# Patient Record
Sex: Male | Born: 1945 | Race: Black or African American | Hispanic: No | Smoking: Never smoker
Health system: Southern US, Community
[De-identification: ages and names within clinical notes are randomized; demographics above are authoritative.]

## PROBLEM LIST (undated history)

## (undated) DIAGNOSIS — S8290XA Unspecified fracture of unspecified lower leg, initial encounter for closed fracture: Secondary | ICD-10-CM

## (undated) DIAGNOSIS — K469 Unspecified abdominal hernia without obstruction or gangrene: Secondary | ICD-10-CM

## (undated) DIAGNOSIS — R001 Bradycardia, unspecified: Secondary | ICD-10-CM

## (undated) HISTORY — PX: TONSILLECTOMY: SHX5217

## (undated) HISTORY — PX: REFRACTIVE SURGERY: SHX103

## (undated) HISTORY — PX: CIRCUMCISION: SHX1350

## (undated) HISTORY — DX: Unspecified abdominal hernia without obstruction or gangrene: K46.9

## (undated) HISTORY — DX: Unspecified fracture of unspecified lower leg, initial encounter for closed fracture: S82.90XA

## (undated) HISTORY — PX: HERNIA REPAIR: SHX51

## (undated) HISTORY — DX: Bradycardia, unspecified: R00.1

---

## 1998-03-06 ENCOUNTER — Ambulatory Visit (HOSPITAL_COMMUNITY): Admission: RE | Admit: 1998-03-06 | Discharge: 1998-03-06 | Payer: Self-pay | Admitting: Gastroenterology

## 2003-02-12 ENCOUNTER — Emergency Department (HOSPITAL_COMMUNITY): Admission: EM | Admit: 2003-02-12 | Discharge: 2003-02-13 | Payer: Self-pay | Admitting: *Deleted

## 2009-01-31 ENCOUNTER — Encounter: Payer: Self-pay | Admitting: Cardiovascular Disease

## 2010-02-07 ENCOUNTER — Ambulatory Visit: Payer: Self-pay | Admitting: Cardiovascular Disease

## 2011-01-21 ENCOUNTER — Encounter: Payer: Self-pay | Admitting: Cardiovascular Disease

## 2011-01-21 ENCOUNTER — Ambulatory Visit (INDEPENDENT_AMBULATORY_CARE_PROVIDER_SITE_OTHER): Payer: Self-pay | Admitting: Cardiovascular Disease

## 2011-01-21 VITALS — BP 139/82 | HR 51 | Ht 75.0 in | Wt 193.0 lb

## 2011-01-21 DIAGNOSIS — R001 Bradycardia, unspecified: Secondary | ICD-10-CM | POA: Insufficient documentation

## 2011-01-21 DIAGNOSIS — I498 Other specified cardiac arrhythmias: Secondary | ICD-10-CM

## 2011-01-21 NOTE — Progress Notes (Signed)
    Joshua Shea Date of Birth  February 11, 1945 [facility]     [place] [facility]  [address]    [address]   [address] 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  He had a negative stress test in the past.   He walks on a regular basis and has no limitations.   He goes to [facility] on [address] as his primary medical care.  No current outpatient prescriptions on file prior to visit.    No Known Allergies  No past medical history on file.  No past surgical history on file.  History  Smoking status  . Never Smoker   Smokeless tobacco  . Not on file    History  Alcohol Use: Not on file    Family History  Problem Relation Age of Onset  . Hypertension Mother     Reviw of Systems:  Reviewed in the HPI.  All other systems are negative.  Physical Exam: BP 139/82  Pulse 51  Ht 6\' 3"  (1.905 m)  Wt 193 lb (87.544 kg)  BMI 24.12 kg/m2 The patient is alert and oriented x 3.  The mood and affect are normal.   Skin: warm and dry.  Color is normal.    HEENT:   [place]/AT , no JVD, normal carotids  Lungs: clear   Heart: RR, bradycardic    Abdomen: soft, non tender, +BS  Extremities:  No c/c/e  Neuro:  Nonfocal, gait is normal    ECG: Sinus bradycardia, early repol  Assessment / Plan:

## 2011-01-21 NOTE — Patient Instructions (Signed)
Your physician recommends that you schedule a follow-up appointment in: as needed basis  

## 2011-01-21 NOTE — Assessment & Plan Note (Signed)
Joshua Shea presents for followup of his bradycardia. He's completely asymptomatic. He's been able to do all of his normal activities without any significant problems. I offer to see him once a year but he would rather come back if he has a new cardiac issues. I'll be happy to see him in the future as needed.

## 2011-01-22 ENCOUNTER — Telehealth: Payer: Self-pay | Admitting: Cardiovascular Disease

## 2011-01-22 NOTE — Telephone Encounter (Signed)
Phone number is listed has been disconnected per recording.  Work number states pt does not work there.

## 2011-01-22 NOTE — Telephone Encounter (Signed)
New msg Pt was told to call back with names of meds he has been taking rapaflo no longer takes and jalyn

## 2011-06-05 ENCOUNTER — Encounter: Payer: Self-pay | Admitting: *Deleted

## 2012-07-05 ENCOUNTER — Ambulatory Visit (INDEPENDENT_AMBULATORY_CARE_PROVIDER_SITE_OTHER): Payer: Medicare Other | Admitting: Urology

## 2012-07-05 DIAGNOSIS — R82998 Other abnormal findings in urine: Secondary | ICD-10-CM

## 2012-07-05 DIAGNOSIS — N400 Enlarged prostate without lower urinary tract symptoms: Secondary | ICD-10-CM

## 2017-09-21 ENCOUNTER — Emergency Department (HOSPITAL_BASED_OUTPATIENT_CLINIC_OR_DEPARTMENT_OTHER): Payer: Medicare Other

## 2017-09-21 ENCOUNTER — Emergency Department (HOSPITAL_COMMUNITY): Payer: Medicare Other

## 2017-09-21 ENCOUNTER — Encounter (HOSPITAL_COMMUNITY): Payer: Self-pay | Admitting: Internal Medicine

## 2017-09-21 ENCOUNTER — Emergency Department (HOSPITAL_COMMUNITY)
Admission: EM | Admit: 2017-09-21 | Discharge: 2017-10-12 | Disposition: E | Payer: Medicare Other | Attending: Emergency Medicine | Admitting: Emergency Medicine

## 2017-09-21 DIAGNOSIS — R55 Syncope and collapse: Secondary | ICD-10-CM | POA: Diagnosis present

## 2017-09-21 DIAGNOSIS — R531 Weakness: Secondary | ICD-10-CM | POA: Diagnosis not present

## 2017-09-21 DIAGNOSIS — R001 Bradycardia, unspecified: Secondary | ICD-10-CM | POA: Diagnosis not present

## 2017-09-21 DIAGNOSIS — R5383 Other fatigue: Secondary | ICD-10-CM | POA: Diagnosis not present

## 2017-09-21 DIAGNOSIS — Z79899 Other long term (current) drug therapy: Secondary | ICD-10-CM | POA: Diagnosis not present

## 2017-09-21 DIAGNOSIS — I469 Cardiac arrest, cause unspecified: Secondary | ICD-10-CM | POA: Diagnosis not present

## 2017-09-21 LAB — CBC WITH DIFFERENTIAL/PLATELET
Abs Immature Granulocytes: 0.1 10*3/uL (ref 0.0–0.1)
BASOS PCT: 1 %
Basophils Absolute: 0.1 10*3/uL (ref 0.0–0.1)
EOS ABS: 0.1 10*3/uL (ref 0.0–0.7)
Eosinophils Relative: 1 %
HCT: 45.6 % (ref 39.0–52.0)
Hemoglobin: 13.2 g/dL (ref 13.0–17.0)
IMMATURE GRANULOCYTES: 1 %
Lymphocytes Relative: 25 %
Lymphs Abs: 2.3 10*3/uL (ref 0.7–4.0)
MCH: 26.7 pg (ref 26.0–34.0)
MCHC: 28.9 g/dL — ABNORMAL LOW (ref 30.0–36.0)
MCV: 92.3 fL (ref 78.0–100.0)
MONOS PCT: 5 %
Monocytes Absolute: 0.4 10*3/uL (ref 0.1–1.0)
NEUTROS PCT: 67 %
Neutro Abs: 6.4 10*3/uL (ref 1.7–7.7)
Platelets: 122 10*3/uL — ABNORMAL LOW (ref 150–400)
RBC: 4.94 MIL/uL (ref 4.22–5.81)
RDW: 14.6 % (ref 11.5–15.5)
WBC: 9.4 10*3/uL (ref 4.0–10.5)

## 2017-09-21 LAB — I-STAT TROPONIN, ED: Troponin i, poc: 0.15 ng/mL (ref 0.00–0.08)

## 2017-09-21 LAB — BASIC METABOLIC PANEL
Anion gap: 17 — ABNORMAL HIGH (ref 5–15)
BUN: 26 mg/dL — ABNORMAL HIGH (ref 8–23)
CALCIUM: 9 mg/dL (ref 8.9–10.3)
CO2: 15 mmol/L — AB (ref 22–32)
Chloride: 113 mmol/L — ABNORMAL HIGH (ref 98–111)
Creatinine, Ser: 2.09 mg/dL — ABNORMAL HIGH (ref 0.61–1.24)
GFR calc non Af Amer: 30 mL/min — ABNORMAL LOW (ref >60)
GFR, EST AFRICAN AMERICAN: 34 mL/min — AB (ref >60)
Glucose, Bld: 184 mg/dL — ABNORMAL HIGH (ref 70–99)
Potassium: 4.4 mmol/L (ref 3.5–5.1)
SODIUM: 145 mmol/L (ref 135–145)

## 2017-09-21 LAB — CBG MONITORING, ED: GLUCOSE-CAPILLARY: 143 mg/dL — AB (ref 70–99)

## 2017-09-21 LAB — ECHOCARDIOGRAM LIMITED
HEIGHTINCHES: 75 in
Weight: 2800 oz

## 2017-09-21 LAB — TROPONIN I: Troponin I: 0.17 ng/mL (ref <0.03)

## 2017-09-21 LAB — BRAIN NATRIURETIC PEPTIDE: B Natriuretic Peptide: 788.6 pg/mL — ABNORMAL HIGH (ref 0.0–100.0)

## 2017-09-21 MED ORDER — SODIUM CHLORIDE 0.9 % IV BOLUS
1000.0000 mL | Freq: Once | INTRAVENOUS | Status: AC
  Administered 2017-09-21: 1000 mL via INTRAVENOUS

## 2017-09-21 MED ORDER — EPINEPHRINE PF 1 MG/10ML IJ SOSY
PREFILLED_SYRINGE | INTRAMUSCULAR | Status: AC | PRN
  Administered 2017-09-21 (×3): 1 via INTRAVENOUS

## 2017-09-21 MED ORDER — EPINEPHRINE PF 1 MG/10ML IJ SOSY
PREFILLED_SYRINGE | INTRAMUSCULAR | Status: AC | PRN
  Administered 2017-09-21: 1 mg via INTRAVENOUS

## 2017-09-21 MED ORDER — EPINEPHRINE PF 1 MG/10ML IJ SOSY
PREFILLED_SYRINGE | INTRAMUSCULAR | Status: AC | PRN
  Administered 2017-09-21: 1 via INTRAVENOUS

## 2017-09-21 MED ORDER — EPINEPHRINE PF 1 MG/10ML IJ SOSY
PREFILLED_SYRINGE | INTRAMUSCULAR | Status: AC | PRN
  Administered 2017-09-21 (×6): 1 mg via INTRAVENOUS

## 2017-09-21 MED ORDER — DOPAMINE-DEXTROSE 3.2-5 MG/ML-% IV SOLN
INTRAVENOUS | Status: AC | PRN
  Administered 2017-09-21: 10 ug/kg/min via INTRAVENOUS

## 2017-09-21 MED ORDER — SODIUM CHLORIDE 0.9 % IV BOLUS: 500.0000 mL | Freq: Once | INTRAVENOUS | Status: DC

## 2017-09-21 MED ORDER — SODIUM CHLORIDE 0.9 % IV SOLN
INTRAVENOUS | Status: AC | PRN
  Administered 2017-09-21: 1000 mL via INTRAVENOUS

## 2017-09-22 ENCOUNTER — Telehealth: Payer: Self-pay

## 2017-09-22 NOTE — Telephone Encounter (Signed)
CSW spoke with pt's daughter, Lessie Dings, last night. CSW informed daughter that pt's belongings are in with security. CSW provided pt's daughter with patient placement phone number.   Montine [address], [facility]  [phone]

## 2017-09-22 NOTE — Progress Notes (Unsigned)
CSW received call from pt's daughter Joshua Shea [phone] seeking to speak with a MD that worked on pt last night. CSW advised Joshua Shea that CSW would attempt to find MD and see if MD is able to speak with her. CSW spoke with MD Deretha Emory and was informed that considering HIPPA laws he would be unable to speak with pt's daughter over the phone. CSW updated daughter via phone and expressed to her that due to HIPPA laws per MD, MD's are not permitted to give information over the phone. CSW was advised by daughter that she is on the way from [place] and EXPECTS to speak with someone once here.   CSW spoke with chaplin services to inform them that pt's daughter is wishing to speak with someone regarding pt. CSW awaits call back from chaplin at this time.  Joshua Shea, MSW, LCSW-A Emergency Department Clinical Social Worker [phone]

## 2017-09-23 MED FILL — Medication: Qty: 1 | Status: AC

## 2017-10-12 NOTE — Code Documentation (Signed)
Xray bedside to confirm ET placement

## 2017-10-12 NOTE — ED Provider Notes (Signed)
[facility] EMERGENCY DEPARTMENT Provider Note   CSN: [id number] Arrival date & time: 10/19/17  1039     History   Chief Complaint Chief Complaint  Patient presents with  . Loss of Consciousness    HPI Joshua Shea is a 72 y.o. male presenting for 3 syncopal episodes that occurred while at work today.  Patient states that he has been feeling fatigued over the past few days with multiple near syncopal episodes however today he states that he began feeling extremely tired and weak and sat down passing out shortly afterwards.  He states this happened 2 more times prior to EMS arrival.  Patient denies lightheadedness or dizziness, only endorses fatigue.  According to triage note patient was pale, cool, diaphoretic and incontinent upon EMS arrival and placed on nonrebreather.  At transfer to bed in the ER oxygen was removed. Monitor showing desaturation in the 80s on room air and was placed back on non-rebreather by nursing staff. I was called into room by nursing staff at this time and upon my evaluation patient sitting comfortably in bed on non-rebreather speaking in full sentences with pulse-ox reading in at 80%. Pulse-ox monitor on patient's ear with poor pleth and patient denying shortness of breath or chest pain, stating that he was feeling well.  Patient denies chest pain, shortness of breath or any other pain upon arrival, states that he is been suffering from "stress syndrome " for the past few months and only reports fatigue and generalized weakness.  Patient denies recent cardiac work-up or medication use on a daily basis. Patient states that his last syncopal episode before today was in August for which he did not seek medical evaluation. Patient denies leg swelling, history of blood clots, cancer, recent surgery/trauma.  Chart reviewed shows that patient was seen at [facility] in [place] on 06/16/2017 for Chest Pain. At that time patient noted to be experiencing  right sided chest pain and shortness of breath as well as episodic dyspnea on exertion. Assessment at that time was Chest pain, unspecified type and weight loss. Patient was advised to follow up with cardiology. Unable to find cardiology note in chart at this time. Patient states that he felt well after that visit and did not want to follow-up with cardiology.   HPI  Past Medical History:  Diagnosis Date  . Bradycardia   . Glaucoma   . Hernia   . Leg fracture    as a child    Patient Active Problem List   Diagnosis Date Noted  . Syncope   . Cardiac arrest (HCC)   . Sinus bradycardia 01/21/2011    Past Surgical History:  Procedure Laterality Date  . CIRCUMCISION    . HERNIA REPAIR    . REFRACTIVE SURGERY    . TONSILLECTOMY          Home Medications    Prior to Admission medications   Medication Sig Start Date End Date Taking? Authorizing Provider  Bimatoprost (LUMIGAN OP) Apply to eye.    [provider]  Brimonidine Tartrate (ALPHAGAN P OP) Apply to eye.    [provider]  Dorzolamide HCl-Timolol Mal (COSOPT OP) Apply to eye.    [provider]  Multiple Vitamins-Minerals (MULTIVITAMINS THER. W/MINERALS) TABS Take 1 tablet by mouth daily.    [provider]  Omega-3 Fatty Acids (FISH OIL PO) Take 1 capsule by mouth as needed.    [provider]    Family History Family History  Problem  Relation Age of Onset  . Hypertension Mother     Social History Social History   Tobacco Use  . Smoking status: Never Smoker  Substance Use Topics  . Alcohol use: Not on file  . Drug use: Not on file     Allergies   Patient has no known allergies.   Review of Systems Review of Systems  Constitutional: Positive for diaphoresis and fatigue. Negative for chills and fever.  Eyes: Negative.  Negative for visual disturbance.  Respiratory: Negative.  Negative for cough, choking and shortness of breath.   Cardiovascular: Negative.   Negative for chest pain and leg swelling.  Gastrointestinal: Negative.  Negative for abdominal pain, blood in stool, diarrhea, nausea and vomiting.  Genitourinary: Negative.  Negative for dysuria and hematuria.  Neurological: Positive for syncope and weakness. Negative for dizziness, seizures, light-headedness and headaches.  All other systems reviewed and are negative.   Physical Exam Updated Vital Signs BP (!) 40/27   Pulse (!) 44   Resp (!) 0   Ht 6\' 3"  (1.905 m)   Wt 79.4 kg   SpO2 100%   BMI 21.87 kg/m   Physical Exam  Constitutional: He is oriented to person, place, and time. He appears well-developed and well-nourished. No distress.  Tired appearing  HENT:  Head: Normocephalic and atraumatic.  Right Ear: External ear normal.  Left Ear: External ear normal.  Nose: Nose normal.  Eyes: Pupils are equal, round, and reactive to light. Conjunctivae and EOM are normal.  Neck: Trachea normal and normal range of motion. No tracheal deviation present.  Cardiovascular: Normal rate, regular rhythm, normal heart sounds and intact distal pulses.  Pulses:      Dorsalis pedis pulses are 2+ on the right side, and 2+ on the left side.       Posterior tibial pulses are 2+ on the right side, and 2+ on the left side.  Pulmonary/Chest: Effort normal and breath sounds normal. No respiratory distress.  Abdominal: Soft. Bowel sounds are normal. There is no tenderness. There is no rebound and no guarding.  Musculoskeletal: Normal range of motion. He exhibits no edema or tenderness.       Right lower leg: Normal. He exhibits no tenderness, no swelling and no edema.       Left lower leg: Normal. He exhibits no tenderness, no swelling and no edema.  Feet:  Right Foot:  Protective Sensation: 3 sites tested. 3 sites sensed.  Left Foot:  Protective Sensation: 3 sites tested. 3 sites sensed.  Neurological: He is alert and oriented to person, place, and time. No sensory deficit. GCS eye subscore is  4. GCS verbal subscore is 5. GCS motor subscore is 6.  Skin: Skin is warm and dry.  Psychiatric: He has a normal mood and affect. His behavior is normal.     ED Treatments / Results  Labs (all labs ordered are listed, but only abnormal results are displayed) Labs Reviewed  BASIC METABOLIC PANEL - Abnormal; Notable for the following components:      Result Value   Chloride 113 (*)    CO2 15 (*)    Glucose, Bld 184 (*)    BUN 26 (*)    Creatinine, Ser 2.09 (*)    GFR calc non Af Amer 30 (*)    GFR calc Af Amer 34 (*)    Anion gap 17 (*)    All other components within normal limits  CBC WITH DIFFERENTIAL/PLATELET - Abnormal; Notable for the following  components:   MCHC 28.9 (*)    Platelets 122 (*)    All other components within normal limits  TROPONIN I - Abnormal; Notable for the following components:   Troponin I 0.17 (*)    All other components within normal limits  BRAIN NATRIURETIC PEPTIDE - Abnormal; Notable for the following components:   B Natriuretic Peptide 788.6 (*)    All other components within normal limits  CBG MONITORING, ED - Abnormal; Notable for the following components:   Glucose-Capillary 143 (*)    All other components within normal limits  I-STAT TROPONIN, ED - Abnormal; Notable for the following components:   Troponin i, poc 0.15 (*)    All other components within normal limits  URINALYSIS, ROUTINE W REFLEX MICROSCOPIC  TROPONIN I  TROPONIN I  D-DIMER, QUANTITATIVE (NOT AT [facility])    EKG EKG Interpretation  Date/Time:  Tuesday September 25, 2017 10:48:34 EDT Ventricular Rate:  89 PR Interval:    QRS Duration: 117 QT Interval:  371 QTC Calculation: 452 R Axis:   98 Text Interpretation:  Sinus rhythm Incomplete right bundle branch block No previous ECGs available Confirmed by Vanetta Mulders [phone]) on 09/25/17 10:54:32 AM Also confirmed by Vanetta Mulders [phone]), editor Barbette Hair [phone])  on 09-25-2017 11:53:13 AM   Radiology Dg Chest  Portable 1 View  Result Date: 09/25/2017 CLINICAL DATA:  Shortness of Breath EXAM: PORTABLE CHEST 1 VIEW COMPARISON:  None. FINDINGS: Cardiac shadow is mildly prominent but accentuated by the portable technique. The lungs are well aerated bilaterally. No focal infiltrate or sizable effusion is seen. No bony abnormality is noted. IMPRESSION: No acute abnormality seen. Electronically Signed   By: Alcide Clever M.D.   On: 09-25-2017 11:31    Procedures Procedures (including critical care time)  CRITICAL CARE Performed by: Bill Salinas Total critical care time: 95 minutes Critical care time was exclusive of separately billable procedures and treating other patients. Critical care was necessary to treat or prevent imminent or life-threatening deterioration. Critical care was time spent personally by me on the following activities: development of treatment plan with patient and/or surrogate as well as nursing, discussions with consultants, evaluation of patient's response to treatment, examination of patient, obtaining history from patient or surrogate, ordering and performing treatments and interventions, ordering and review of laboratory studies, ordering and review of radiographic studies, pulse oximetry and re-evaluation of patient's condition.  Medications Ordered in ED Medications  sodium chloride 0.9 % bolus 1,000 mL (0 mLs Intravenous Stopped 09-25-2017 1301)  EPINEPHrine (ADRENALIN) 1 MG/10ML injection (1 mg Intravenous Given September 25, 2017 1444)  EPINEPHrine (ADRENALIN) 1 MG/10ML injection (1 mg Intravenous Given 09/25/17 1450)  EPINEPHrine (ADRENALIN) 1 MG/10ML injection (1 mg Intravenous Given 09-25-2017 1458)  EPINEPHrine (ADRENALIN) 1 MG/10ML injection (1 Syringe Intravenous Given 09-25-17 1512)  0.9 %  sodium chloride infusion (1,000 mLs Intravenous New Bag/Given Sep 25, 2017 1513)  DOPamine (INTROPIN) 800 mg in dextrose 5 % 250 mL (3.2 mg/mL) infusion (10 mcg/kg/min  79.4 kg Intravenous New  Bag/Given 25-Sep-2017 1518)  EPINEPHrine (ADRENALIN) 1 MG/10ML injection (1 Syringe Intravenous Given 2017/09/25 1520)     Initial Impression / Assessment and Plan / ED Course  I have reviewed the triage vital signs and the nursing notes.  Pertinent labs & imaging results that were available during my care of the patient were reviewed by me and considered in my medical decision making (see chart for details).  Clinical Course as of Sep 22 1827  Tue 09-25-17  1215 Consult to Cardiology; they are coming to see patient in ED.   [BM]  1554 Consult called to Medical Examiner Hyacinth Meeker, advises that death certificate be completed by ED MD.   [BM]    Clinical Course User Index [BM] Bill Salinas, PA-C   Patient presenting for multiple syncopal episodes today as well as syncopal episode in August.  Unable to locate significant cardiac work-up in chart at this time.  Patient with syncope, however denying history of chest pain or shortness of breath on my evaluation; question of cardiac arrhythmia today.  Troponin shows elevation of 0.15 cardiology consulted to evaluate patient for admission.  Patient has also been seen and evaluated Zackowski who agrees with admission for elderly patient with the syncope and questionable cardiac arrhythmia. Patient with o2 saturation of 100% on non-rebreather, heart rate maintaining at 80 bpm, sitting comfortably in bed, no acute distress and without complaints. Patient informed of need for admission and is reluctant but agreeable.  --------------------------------------------------------------------------------  While passing room to evaluate another patient I noticed that the patient was writhing in bed. I entered the room to re-evaluate this patient. Patient was initally staring off to the ceiling and moving limbs sporatically. When asked what was wrong patient stated "I feel like I'm going to faint again." Run of 5 PVCs noticed on monitor. I asked the patient if  he was feeling any pain or having any trouble breathing which he denied stating again "I'm going to faint." Patient's heart rate decreased at that time from the mid-80s down to the 50s and I brought Dr. Deretha Emory back into the room.  Patient was initially responsive to our questions, still denying pain, repeating that he was going to faint. Heart rate continued to decrease followed by apnea. Code Blue was called. Ventilation with bag valve mask and CPR begun.  Code Blue coordinated by Dr. Deretha Emory.  Initial dose of atropine given due to bradycardia however shortly after patient became pulseless.  Patient intubated, multiple rounds of epinepherine given.  Pulses regained multiple times, epinephrine drip started.  Unfortunately patient continued to decline and after approximately 1 hour of coding efforts were ceased. ---------------------------------------------------------------- After event patient's close friend arrived and spoke to Dr. Deretha Emory at length about what has occurred today.  Patient's friend states that the patient had been experiencing a variety of symptoms since the beginning this summer including syncope and chest pain however refused to follow-up with a medical provider, attributing his symptoms to increased stress.   Medical examiner contacted, case explained medical examiner Hyacinth Meeker advises that death certificate be completed by Dr. Deretha Emory.  Dr. Deretha Emory has also spoken to the medical examiner.   Note: Portions of this report may have been transcribed using voice recognition software. Every effort was made to ensure accuracy; however, inadvertent computerized transcription errors may still be present.  Final Clinical Impressions(s) / ED Diagnoses   Final diagnoses:  None    ED Discharge Orders    None       Elizabeth Palau 09-26-17 2049    Vanetta Mulders, MD 10/07/17 2238

## 2017-10-12 NOTE — Code Documentation (Signed)
No pulse, astyole, CPR restarted

## 2017-10-12 NOTE — Code Documentation (Signed)
CPR continues, preparing to intubate

## 2017-10-12 NOTE — Code Documentation (Signed)
NO pulse, CPR initiated

## 2017-10-12 NOTE — Code Documentation (Signed)
5000u Heparin given

## 2017-10-12 NOTE — Code Documentation (Signed)
No pulse, CPR restarted.

## 2017-10-12 NOTE — ED Notes (Signed)
ED Provider at bedside. 

## 2017-10-12 NOTE — Code Documentation (Signed)
Epi drip initiated at 93mcg/min NS bolus also initiated at gravity

## 2017-10-12 NOTE — Code Documentation (Signed)
CPR stopped, pulse found 

## 2017-10-12 NOTE — Progress Notes (Signed)
RT Placed patient on the ventilator when patient would have a return of pulses and disconnected from the ventilator to manually resuscitate him with the ambu bag during each period of CPR. RT will leave ETT in place.

## 2017-10-12 NOTE — Code Documentation (Signed)
Astoyle-CPR started

## 2017-10-12 NOTE — Code Documentation (Addendum)
Rhythm check, pulse present. CPR paused. Epi drip ordered, pharmacy preparing

## 2017-10-12 NOTE — Code Documentation (Signed)
Pulse check, present. CPR held

## 2017-10-12 NOTE — Code Documentation (Signed)
CPR initiated

## 2017-10-12 NOTE — Code Documentation (Signed)
CPR restarted Pt in asystole

## 2017-10-12 NOTE — Code Documentation (Signed)
CPR stopped, pulse found

## 2017-10-12 NOTE — Code Documentation (Signed)
CPR paused to intubate pt

## 2017-10-12 NOTE — Code Documentation (Signed)
Cardiology @ bedside.

## 2017-10-12 NOTE — Code Documentation (Signed)
Epi gtt incr to 30

## 2017-10-12 NOTE — Code Documentation (Signed)
ET tube is 25 at the teeth No palpable pulse, CPR initiated

## 2017-10-12 NOTE — Code Documentation (Signed)
CPR stopped, Pulse found

## 2017-10-12 NOTE — ED Notes (Signed)
Patient's belongings taken to security

## 2017-10-12 NOTE — ED Notes (Signed)
This RN spoke with pt's daughter, Lessie Dings, belongings placed with security.  Aliyah Hatcher-daughter [phone] Tommie Raymond [phone]

## 2017-10-12 NOTE — Code Documentation (Addendum)
CPR stopped, pulse found. Pt Sinus Joshua Shea Foley

## 2017-10-12 NOTE — Code Documentation (Signed)
Epi gtt incr to 10

## 2017-10-12 NOTE — Code Documentation (Signed)
Pulse returned, CPR held

## 2017-10-12 NOTE — ED Notes (Signed)
1428Atropine given, verbal order from MD  1429 1 of Epi given  CPR in progress

## 2017-10-12 NOTE — Code Documentation (Signed)
1L NS bolus D/C'd, Dopamine gtt D/C'd, Epi gtt D/C'd

## 2017-10-12 NOTE — H&P (Signed)
Cardiology Admission History and Physical:   Patient ID: Joshua Shea MRN: [id number]; DOB: 05-Sep-1945   Admission date: 10/06/17  Primary Care Provider: Patient, No Pcp Per Primary Cardiologist: No primary care provider on file. Previously seen by Dr. Elease Hashimoto Primary Electrophysiologist:  None   Chief Complaint:  Syncope and Collapse  Patient Profile:   Joshua Shea is a 72 y.o. male with PMH significant for asymptomatic sinus bradycardia (2013), BPH, h/o syncope and collapse (normal echo stress in 2011, 2018), self reported chronic stress syndrome, degenerative joint disease and glaucoma that is evaluated by cardiology today d/t syncope and collapse and at the request of Harlene Salts, PA-C.  History of Present Illness:   Joshua Shea is a 72 yo male with PMH as above.   Previous cardiac imaging and workup includes: -01/31/2009 normal stress echo test for chest pain with EF 55-60%, moderate LVH, mild MR, TR and normal chronotropic response to exercise.  - 01/21/2011 outpatient visit with Dr. Kristeen Miss for asymptomatic sinus bradycardia with documentation indicating no need for follow-up unless he developed a new cardiac issue.  - 02/19/2016 ETT for bradycardia and chest pain that was ruled a normal stress echo with further detail as copied below in the appropriate cardiac studies section.   Since 01/2017: Patient reports an increase in fatigue and SOB since the start of this year. He also notes lower right sided chest tightness that will occasionally also be on his left side and sometimes spread across the anterior chest. Of note, patient reports he fasts and has poor hydration and high levels of stress. On 06/2017, patient had outpatient office visit for chest pain and declining weight d/t stress and medications at that time included xanax. 08/12/2017: Patient presented to [facility] ED for fatigue and syncope and reportedly before using the restroom. Per this discharge  documentation, patient reported increase in fatigue since, SOB, and BP since 01/2017. These issues were reportedly discussed with his PCP with high suspicion for stress etiology.   October 06, 2017: Today, patient presented to the [facility] Emergency Department from [employer] work as a school bus driver with c/o generalized weakness and syncope x3. He reportedly suffered three episodes of syncope, each a couple minutes apart and during which time he was reportedly only unconscious for minutes. He denies any prodrome leading up to these three episodes (unlike in August, when he felt dizzy and SOB before syncope). He does report that he felt increasingly weak and fatigued after each episode. This morning, and after his most recent episode, he was reportedly found unresponsive on his school bus by fellow employees. EMS was called. Upon EMS arrival, patient was noted as pale, cool, diaphoretic, and incontinent. He denied SOB, chest pain, and abdominal pain at that time. He was placed on NRB by EMS during transport and again in ED with improvement in his SOB and improvement in O2 sats from 82%  96% at 15L 02 . Patient reports he does not smoke, drink, or use any illicit substances. He does report high blood pressure not yet officially diagnosed but present at several outpatient MD appointments.   10-06-17 In the ED Vitals: HR 94 bpm, RR 34, BP 112/79, SpO2 90% on nonrebreather mask Labs significant for: Na 145, K4.4, glucose 184, Cr 2.09, BUN 26 WBC 9.4, hemoglobin 13.8, PLTs 122. POC troponin: 0.15 EKG: NSR, 89 bpm, incomplete RBBB. Previous EKG from 2013 shows sinus brady, 50 bpm, 1st degree AV block with early repolarization.  CXR: No acute abnormality  *Pending  results of Ddimer, BNP, repeat troponin I (and cycling) * Syncope ddx includes syncope d/t respiratory distress and hypoxia with suspicion for PE (pending ddimer); neurocardiogenic from high sympathetic tone /stress; arrhythmia / sinus pause - not yet seen on ekg  / telemetry - continue to monitor on telemetry; medications with patient on timolol drops; volume overload and SOB with worsening LV function as patient report of uncontrolled HTN (pending ddimer, consider echo); orthostatic hypotension (pending orthostatics). Patient report of poor oral intake and hydration and neurologic given patient has had episodes like this in the past with negative stress echos.    Past Medical History:  Diagnosis Date  . Bradycardia   . Glaucoma   . Hernia   . Leg fracture    as a child    Past Surgical History:  Procedure Laterality Date  . CIRCUMCISION    . HERNIA REPAIR    . REFRACTIVE SURGERY    . TONSILLECTOMY       Medications Prior to Admission: Prior to Admission medications   Medication Sig Start Date End Date Taking? Authorizing Provider  Bimatoprost (LUMIGAN OP) Apply to eye.    [provider]  Brimonidine Tartrate (ALPHAGAN P OP) Apply to eye.    [provider]  Dorzolamide HCl-Timolol Mal (COSOPT OP) Apply to eye.    [provider]  Multiple Vitamins-Minerals (MULTIVITAMINS THER. W/MINERALS) TABS Take 1 tablet by mouth daily.    [provider]  Omega-3 Fatty Acids (FISH OIL PO) Take 1 capsule by mouth as needed.    [provider]     Allergies:   No Known Allergies  Social History:   Social History   Socioeconomic History  . Marital status: Divorced    Spouse name: Not on file  . Number of children: Not on file  . Years of education: Not on file  . Highest education level: Not on file  Occupational History  . Not on file  Social Needs  . Financial resource strain: Not on file  . Food insecurity:    Worry: Not on file    Inability: Not on file  . Transportation needs:    Medical: Not on file    Non-medical: Not on file  Tobacco Use  . Smoking status: Never Smoker  Substance and Sexual Activity  . Alcohol use: Not on file  . Drug use: Not on file  . Sexual activity: Not on  file  Lifestyle  . Physical activity:    Days per week: Not on file    Minutes per session: Not on file  . Stress: Not on file  Relationships  . Social connections:    Talks on phone: Not on file    Gets together: Not on file    Attends religious service: Not on file    Active member of club or organization: Not on file    Attends meetings of clubs or organizations: Not on file    Relationship status: Not on file  . Intimate partner violence:    Fear of current or ex partner: Not on file    Emotionally abused: Not on file    Physically abused: Not on file    Forced sexual activity: Not on file  Other Topics Concern  . Not on file  Social History Narrative  . Not on file    Family History:   The patient's family history includes Hypertension in his mother.    ROS:  Please see the history of present  illness.  All other ROS reviewed and negative.     Physical Exam/Data:   Vitals:   Oct 13, 2017 1045 10-13-17 1049 10-13-2017 1051  BP: 112/79  112/78  Pulse: 94  88  Resp: (!) 34  20  SpO2: 90%  92%  Weight:  79.4 kg   Height:  6\' 3"  (1.905 m)    No intake or output data in the 24 hours ending 2017-10-13 1227 Filed Weights   10/13/17 1049  Weight: 79.4 kg   Body mass index is 21.87 kg/m.  General:  Frail elderly AA gentleman on NRB mask, anxious HEENT: distended neck veins noted on exam Lymph: no adenopathy Neck: + JVD, distended neck veins  Endocrine:  No thryomegaly Vascular: No carotid bruits; FA pulses 2+ bilaterally without bruits  Cardiac:  normal S1, S2; RRR; no murmur  Lungs:  clear to auscultation bilaterally, no wheezing, rhonchi or rales  Abd: soft, nontender, no hepatomegaly  Ext: 1+ to trace edema Musculoskeletal:  No deformities, BUE and BLE strength normal and equal Skin: warm and dry  Neuro:  CNs 2-12 intact, no focal abnormalities noted Psych:  Normal affect    EKG:  The ECG that was done in the ED & was personally reviewed and demonstrates  NSR, 89  bpm, incomplete RBBB. Previous EKG from 2013 shows sinus brady, 50 bpm, 1st degree AV block with early repolarization.    Relevant CV Studies: 02/19/16 ETT Test conclusion patient walked for 7 minutes-advanced Bruce protocol GXT.  He stopped due to achieving target heart rate and denied any chest pain or dyspnea.  Peak heart rate of 153. Non-specific ST/T wave abnormality with exercise that resolved quickly during recovery phase.  He had a brief run of ventricular ectopy just after completing the treadmill.  Rest images showed normal LV function.  Moderate LVH.  Stress images showed normal increase in left ventricular contractility and thickening in all areas of the left ventricle.  Normal stress echo.  Normal chronotropic response to exercise.  01/31/2009 Echo stress LV: Normal size, moderate LVH, normal LV systolic function, no segmental WMA, impaired relaxation.  LA: Normal size and function.  RA: Normal size and function. RV: Normal size and function. Aorta: Normal Ao and root, no aortic stenosis, no vegetation: Mild aortic sclerosis calcification /Calcification, trileaflet valve, no aortic stenosis, no vegetation. Mitral valve: mild regurg, no mitral annular calcification. TV: mild regurg. PV: normal. PASP: normal. Normal pericardium. IMPRESSION: Normal LV systolic function.  Moderate LVH.  Mild mitral regurgitation, mild tricuspid regurgitation.  Stress echo: Patient walked for 7 minutes in advance based protocol GXT.  Stopped due to achieving target heart rate.  No chest pain or dyspnea.  Peak heart rate of 153 (99% predicted max heart rate).  Resting images: Normal LV function.  Stress echo images: Normal increase in left ventricular contractility and thickening in all areas of the left ventricle.  Normal stress echo.  Normal chronotropic response to exercise.  Laboratory Data:  ChemistryNo results for input(s): NA, K, CL, CO2, GLUCOSE, BUN, CREATININE, CALCIUM, GFRNONAA, GFRAA, ANIONGAP in the  last 168 hours.  No results for input(s): PROT, ALBUMIN, AST, ALT, ALKPHOS, BILITOT in the last 168 hours. Hematology Recent Labs  Lab October 13, 2017 1050  WBC 9.4  RBC 4.94  HGB 13.2  HCT 45.6  MCV 92.3  MCH 26.7  MCHC 28.9*  RDW 14.6  PLT 122*   Cardiac EnzymesNo results for input(s): TROPONINI in the last 168 hours.  Recent Labs  Lab 2017/10/13 1126  TROPIPOC 0.15*    BNPNo results for input(s): BNP, PROBNP in the last 168 hours.  DDimer No results for input(s): DDIMER in the last 168 hours.  Radiology/Studies:  Dg Chest Portable 1 View  Result Date: October 06, 2017 CLINICAL DATA:  Shortness of Breath EXAM: PORTABLE CHEST 1 VIEW COMPARISON:  None. FINDINGS: Cardiac shadow is mildly prominent but accentuated by the portable technique. The lungs are well aerated bilaterally. No focal infiltrate or sizable effusion is seen. No bony abnormality is noted. IMPRESSION: No acute abnormality seen. Electronically Signed   By: Alcide Clever M.D.   On: 06-Oct-2017 11:31    Assessment and Plan:   Syncope and Collapse in the setting of hypoxia and SOB - No current or recent chest pain or tightness. No prodrome or report of tachy, palpitations. Previous cardiac imaging as above with negative stress echos following similar past syncope / CP complaints. No reported precipitating factors. Happened at rest without emotional stressors. - EKG as above NSR, 89 bpm, incomplete RBBB. Previously sinus brady, 50 bpm, 1st degree AV block with early repolarization. Continue to cycle EKGs. Note that patient previously sinus brady and now 89-90bpm. ?etiology. Pending lab results.  - poc Troponin as above 0.15. Will cycle Troponin.  - Pending results of BNP to assess volume status. Pending Ddimer to r/o PE given SOB and syncope. If ddimer positive, proceed to CT angio. If BNP elevated, consider echo to assess EF. If troponin I positive, would highly recommend further ischemic workup with cardiac CT or (considering past  negative stress echos), a cardiac cath with consideration of renal function (Cr 2.09).  - Ddx as above in HPI  - Further updates, evaluation, management, and recommendation with lab results and cycling EKGs and/or pending admission.  Severity of Illness: The appropriate patient status for this patient is INPATIENT. Inpatient status is judged to be reasonable and necessary in order to provide the required intensity of service to ensure the patient's safety. The patient's presenting symptoms, physical exam findings, and initial radiographic and laboratory data in the context of their chronic comorbidities is felt to place them at high risk for further clinical deterioration. Furthermore, it is not anticipated that the patient will be medically stable for discharge from the hospital within 2 midnights of admission. The following factors support the patient status of inpatient.   " The patient's presenting symptoms include SOB with syncope and collapse " The worrisome physical exam findings include SOB with syncope and collapse. " The initial radiographic and laboratory data are worrisome because of ---> pending ddimer. If positive, proceed to CT angio. " The chronic co-morbidities include PMH negative for cardiac disease other than previous workup for asymptomatic bradycardia.  * I certify that at the point of admission it is my clinical judgment that the patient will require inpatient hospital care spanning beyond 2 midnights from the point of admission due to high intensity of service, high risk for further deterioration and high frequency of surveillance required.*   For questions or updates, please contact [facility] Please consult [email or web address] for contact info under        Signed, Lennon Alstrom, PA-C  10-06-2017 12:27 PM

## 2017-10-12 NOTE — ED Notes (Signed)
Patient placed on NRB by this RN. O2 sat on 3L O2 82% with a good pleth. Patient O2 sats now 96% on NRB at 15L O2.

## 2017-10-12 NOTE — Code Documentation (Signed)
[place], no pulse, CPR restarted

## 2017-10-12 NOTE — ED Triage Notes (Signed)
Pt here from work c/o generalized weakness since this morning. 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 Pt reported 3 syncopal episodes. Upon EMS arrival, patient was pale, cool, diaphoretic, and incontinent. Pt reported chronic stress syndrome. Denies shortness of breath, chest pain, and abdominal pain for EMS. Pt placed on NRB by EMS during transport and he reported feeling better. Placed on 3L O2 for this RN.

## 2017-10-12 NOTE — Code Documentation (Addendum)
[place], no pulse CPR restarted

## 2017-10-12 NOTE — Progress Notes (Signed)
  Echocardiogram 2D Echocardiogram limited has been performed.  Joshua Shea 09/30/17, 3:33 PM

## 2017-10-12 NOTE — ED Notes (Signed)
Lab at bedside obtaining labwork. Portable chest x-ray at bedside.

## 2017-10-12 DEATH — deceased

## 2019-12-24 IMAGING — DX DG CHEST 1V PORT
1 series · 1 of 1 positions shown · non-contrast
Comparison: None.

CLINICAL DATA: Shortness of Breath

EXAM:
PORTABLE CHEST 1 VIEW

[chest]
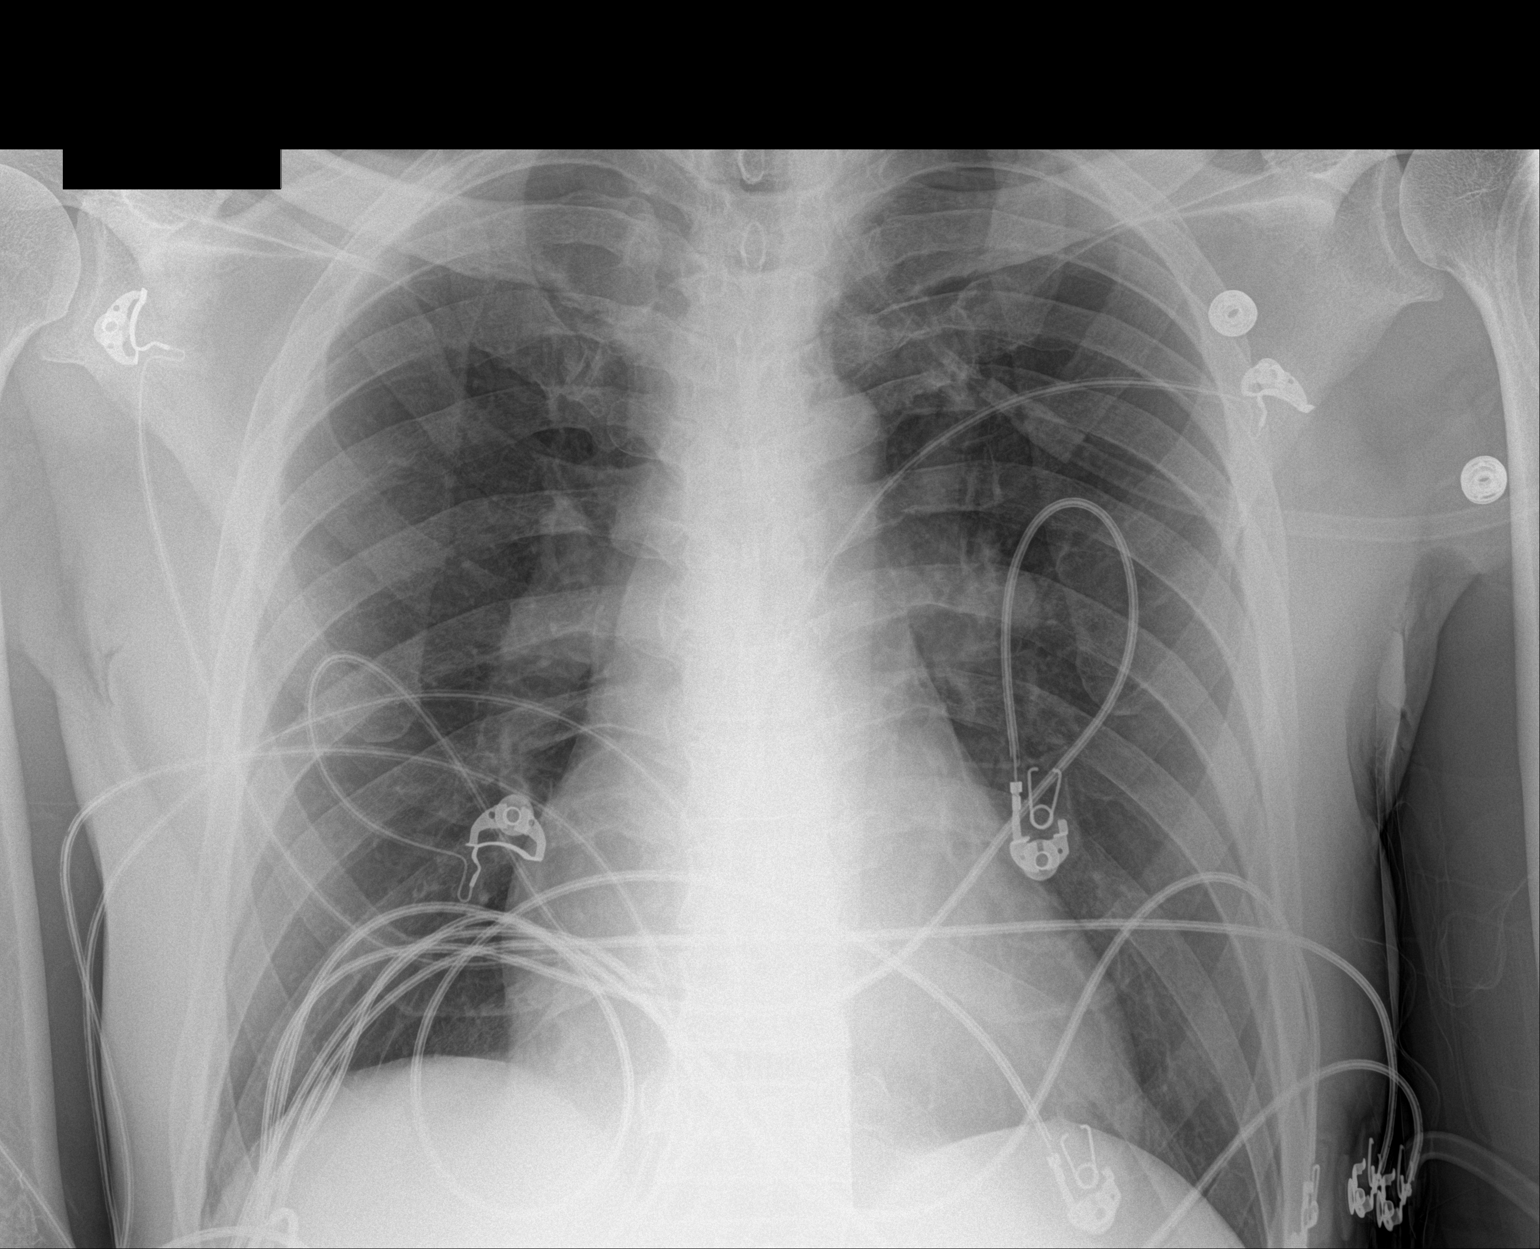

[1 of 1 positions shown; findings below may reference images not displayed]

FINDINGS: Cardiac shadow is mildly prominent but accentuated by the portable
technique. The lungs are well aerated bilaterally. No focal
infiltrate or sizable effusion is seen. No bony abnormality is
noted.
IMPRESSION: No acute abnormality seen.
